# Patient Record
Sex: Male | Born: 1990 | Race: White | Hispanic: No | Marital: Single | Smoking: Never smoker
Health system: Southern US, Community
[De-identification: ages and names within clinical notes are randomized; demographics above are authoritative.]

---

## 2018-03-15 ENCOUNTER — Encounter (HOSPITAL_COMMUNITY): Payer: Self-pay

## 2018-03-15 ENCOUNTER — Emergency Department (HOSPITAL_COMMUNITY): Payer: Self-pay

## 2018-03-15 ENCOUNTER — Emergency Department (HOSPITAL_COMMUNITY)
Admission: EM | Admit: 2018-03-15 | Discharge: 2018-03-16 | Disposition: A | Discharge status: Home / Self Care | Payer: Self-pay | Attending: Emergency Medicine | Admitting: Emergency Medicine

## 2018-03-15 ENCOUNTER — Other Ambulatory Visit: Payer: Self-pay

## 2018-03-15 DIAGNOSIS — S61452A Open bite of left hand, initial encounter: Secondary | ICD-10-CM | POA: Insufficient documentation

## 2018-03-15 DIAGNOSIS — Y9389 Activity, other specified: Secondary | ICD-10-CM | POA: Insufficient documentation

## 2018-03-15 DIAGNOSIS — W540XXA Bitten by dog, initial encounter: Secondary | ICD-10-CM | POA: Insufficient documentation

## 2018-03-15 DIAGNOSIS — Y999 Unspecified external cause status: Secondary | ICD-10-CM | POA: Insufficient documentation

## 2018-03-15 DIAGNOSIS — Y929 Unspecified place or not applicable: Secondary | ICD-10-CM | POA: Insufficient documentation

## 2018-03-15 DIAGNOSIS — Z23 Encounter for immunization: Secondary | ICD-10-CM | POA: Insufficient documentation

## 2018-03-15 MED ORDER — AMOXICILLIN-POT CLAVULANATE 875-125 MG PO TABS: 1.0000 | ORAL_TABLET | Freq: Two times a day (BID) | ORAL | 0 refills | Status: AC

## 2018-03-15 MED ORDER — TETANUS-DIPHTH-ACELL PERTUSSIS 5-2.5-18.5 LF-MCG/0.5 IM SUSP
0.5000 mL | Freq: Once | INTRAMUSCULAR | Status: AC
  Administered 2018-03-15: 0.5 mL via INTRAMUSCULAR
  Filled 2018-03-15: qty 0.5

## 2018-03-15 MED ORDER — LIDOCAINE HCL (PF) 1 % IJ SOLN
10.0000 mL | Freq: Once | INTRAMUSCULAR | Status: DC
  Filled 2018-03-15: qty 10

## 2018-03-15 MED ORDER — AMOXICILLIN-POT CLAVULANATE 875-125 MG PO TABS
1.0000 | ORAL_TABLET | Freq: Once | ORAL | Status: AC
  Administered 2018-03-16: 1 via ORAL
  Filled 2018-03-15: qty 1

## 2018-03-15 MED ORDER — OXYCODONE-ACETAMINOPHEN 5-325 MG PO TABS
1.0000 | ORAL_TABLET | Freq: Once | ORAL | Status: AC
  Administered 2018-03-15: 1 via ORAL
  Filled 2018-03-15: qty 1

## 2018-03-15 MED ORDER — HYDROCODONE-ACETAMINOPHEN 5-325 MG PO TABS: 1.0000 | ORAL_TABLET | Freq: Four times a day (QID) | ORAL | 0 refills | Status: AC | PRN

## 2018-03-15 NOTE — ED Provider Notes (Signed)
I saw and evaluated the patient, reviewed the resident's note and I agree with the findings and plan.  Pertinent History: The patient is a 27 year old male who suffered an injury to his left hand tonight when a pit bull belonging to his neighbor jumped up and bit him on the thenar area of his left hand.  This was acute in onset, the pain is persistent, worse with palpation and associated with a small amount of bleeding.  He was given a tetanus shot on arrival as he was out of date.  On exam the patient does have an open gaping wound which is irregular in its borders.  Underlying muscle is exposed, there is no deep tissue seen such as vessels, nerves or bones.   I was personally present and directly supervised the following procedures:  Wound care / repair - see residents note Laceration repair  Final diagnoses:  Dog bite, initial encounter  Dog bite of left hand, initial encounter      Eber HongMiller, Taleeya Blondin, MD 03/16/18 0001

## 2018-03-15 NOTE — ED Provider Notes (Signed)
[facility] EMERGENCY DEPARTMENT Provider Note   CSN: [id number] Arrival date & time: 03/15/18  2058     History   Chief Complaint Chief Complaint  Patient presents with  . Animal Bite    HPI Cameron Rodgers is a 27 y.o. male.  The history is provided by the patient.  Animal Bite  Contact animal:  Dog Location:  Hand Hand injury location:  L hand Time since incident:  1 hour Pain details:    Quality:  Aching   Severity:  Moderate   Timing:  Constant Incident location: at neighbors house. Notifications:  Animal control (animal control form filled out here) Animal's rabies vaccination status:  Up to date Animal in possession: yes   Tetanus status:  Unknown Associated symptoms: swelling   Associated symptoms: no fever and no numbness     History reviewed. No pertinent past medical history.  There are no active problems to display for this patient.   History reviewed. No pertinent surgical history.     Home Medications    Prior to Admission medications   Medication Sig Start Date End Date Taking? Authorizing Provider  amoxicillin-clavulanate (AUGMENTIN) 875-125 MG tablet Take 1 tablet by mouth every 12 (twelve) hours for 7 days. 03/15/18 03/22/18  [name], DO  HYDROcodone-acetaminophen (NORCO/VICODIN) 5-325 MG tablet Take 1 tablet by mouth every 6 (six) hours as needed for moderate pain or severe pain. 03/15/18   [name], DO    Family History History reviewed. No pertinent family history.  Social History Social History   Tobacco Use  . Smoking status: Never Smoker  Substance Use Topics  . Alcohol use: No    Frequency: Never  . Drug use: No     Allergies   Patient has no known allergies.   Review of Systems Review of Systems  Constitutional: Negative for chills and fever.  HENT: Negative for sore throat.   Respiratory: Negative for shortness of breath.   Cardiovascular: Negative for chest pain.  Gastrointestinal: Negative  for abdominal pain, nausea and vomiting.  Musculoskeletal: Negative for back pain and neck pain.  Skin: Positive for wound.  Neurological: Negative for weakness and numbness.  All other systems reviewed and are negative.    Physical Exam Updated Vital Signs BP (!) 157/89   Pulse (!) 116   Temp 98.8 F (37.1 C)   Resp 19   SpO2 100%   Physical Exam  Constitutional: He appears well-developed and well-nourished.  HENT:  Head: Normocephalic and atraumatic.  Mouth/Throat: Oropharynx is clear and moist.  Eyes: Conjunctivae are normal.  Neck: Neck supple.  Cardiovascular: Normal rate, regular rhythm and intact distal pulses.  Pulmonary/Chest: Effort normal and breath sounds normal. No respiratory distress. He has no wheezes. He has no rales.  Abdominal: Soft. He exhibits no distension. There is no tenderness. There is no guarding.  Musculoskeletal: He exhibits no edema.       Left hand: He exhibits laceration.       Hands: Neurological: He is alert.  Skin: Skin is warm and dry.  Psychiatric: He has a normal mood and affect.  Nursing note and vitals reviewed.      ED Treatments / Results  Labs (all labs ordered are listed, but only abnormal results are displayed) Labs Reviewed - No data to display  EKG  EKG Interpretation None       Radiology Dg Hand Complete Left  Result Date: 03/15/2018 CLINICAL DATA:  Recent dog bite with significant soft  tissue injury EXAM: LEFT HAND - COMPLETE 3+ VIEW COMPARISON:  None. FINDINGS: Significant soft tissue defect is noted along the first metacarpal consistent with the recent injury. No acute fracture or dislocation is seen. No other focal abnormality is noted. IMPRESSION: Soft tissue defect consistent with the recent injury. No acute bony abnormality is noted. Electronically Signed   By: Alcide Clever M.D.   On: 03/15/2018 21:33    Procedures .Marland KitchenLaceration Repair Date/Time: 03/15/2018 11:42 PM Performed by: [name],  DO Authorized by: Eber Hong, MD   Consent:    Consent obtained:  Verbal   Consent given by:  Patient Laceration details:    Location:  Hand   Hand location:  L palm   Length (cm):  4   Depth (mm):  7 Pre-procedure details:    Preparation:  Patient was prepped and draped in usual sterile fashion and imaging obtained to evaluate for foreign bodies Exploration:    Wound exploration: wound explored through full range of motion and entire depth of wound probed and visualized   Treatment:    Area cleansed with:  Saline   Amount of cleaning:  Extensive   Irrigation solution:  Sterile saline   Irrigation volume:  2000 Skin repair:    Repair method:  Sutures   Suture size:  4-0   Suture material:  Chromic gut   Suture technique: 4 vertical mattress, 1 horizontal mattress, 2 simple interrupted.   Number of sutures:  7 Approximation:    Approximation:  Loose   Vermilion border: well-aligned   Post-procedure details:    Dressing:  Antibiotic ointment, splint for protection, sterile dressing and non-adherent dressing   Patient tolerance of procedure:  Tolerated well, no immediate complications   (including critical care time)  Medications Ordered in ED Medications  lidocaine (PF) (XYLOCAINE) 1 % injection 10 mL (not administered)  amoxicillin-clavulanate (AUGMENTIN) 875-125 MG per tablet 1 tablet (not administered)  Tdap (BOOSTRIX) injection 0.5 mL (0.5 mLs Intramuscular Given 03/15/18 2151)  oxyCODONE-acetaminophen (PERCOCET/ROXICET) 5-325 MG per tablet 1 tablet (1 tablet Oral Given 03/15/18 2151)     Initial Impression / Assessment and Plan / ED Course  I have reviewed the triage vital signs and the nursing notes.  Pertinent labs & imaging results that were available during my care of the patient were reviewed by me and considered in my medical decision making (see chart for details).     Patient is a 27 year old male with no significant medical history who presents with pit  bull dog bite to the left hand.  Patient was talking with his neighbor and the dog bit his left hand.  Dog is in position and is up-to-date on its shots reportedly.  Patient is unsure of tetanus status so this is updated here.  On exam he has a 4 cm deep irregular laceration with a skin flap.  Slow venous oozing.  No arterial bleed.  Muscle, nerve, blood vessels can be seen.  Thumb is neurovascularly intact.  Did not appear to be any tendon injury.  X-ray did not show any signs of fracture or foreign body.  Wound is copiously irrigated with normal saline.  I spoke with Dr. Janee Morn with orthopedic hand surgery who recommended loose closure to allow drainage, and follow-up in his clinic on Thursday.  Patient started on Augmentin.  Pain controlled with Percocet.  Wound is dressed with antibiotic ointment, sterile gauze and a thumb spica splint.  Work note given.  Prescription for Norco and Augmentin given.  Final Clinical Impressions(s) / ED Diagnoses   Final diagnoses:  Dog bite, initial encounter  Dog bite of left hand, initial encounter    ED Discharge Orders        Ordered    HYDROcodone-acetaminophen (NORCO/VICODIN) 5-325 MG tablet  Every 6 hours PRN     03/15/18 2328    amoxicillin-clavulanate (AUGMENTIN) 875-125 MG tablet  Every 12 hours     03/15/18 2328       Dwana MelenaDong, Riyan Gavina, DO 03/15/18 2344    Eber HongMiller, Brian, MD 03/16/18 0000

## 2018-03-15 NOTE — ED Triage Notes (Signed)
Pt endorses being bit by his neighbors dog in the left hand, pt has deep bite with bleeding around the left thumb and possible deformity to the left thumb. Pt states that hand is "numb" Unknown if dog had up to date vaccinations. VSS

## 2018-03-15 NOTE — Discharge Instructions (Signed)
Follow-up with Dr. Janee Mornhompson likely on Thursday.  If you develop fevers, chills, redness, or pus draining from the wound, return to the ED as this likely means the wound has become infected.

## 2018-03-16 NOTE — Progress Notes (Signed)
Orthopedic Tech Progress Note Patient Details:  Cameron EllisRalf Rodgers [birth year] [id number]  Ortho Devices Type of Ortho Device: Thumb velcro splint Ortho Device/Splint Location: lue Ortho Device/Splint Interventions: Ordered, Application, Adjustment   Post Interventions Patient Tolerated: Well Instructions Provided: Care of device, Adjustment of device   Trinna PostMartinez, Aanchal Cope J 03/16/2018, 1:18 AM

## 2018-03-16 NOTE — ED Notes (Signed)
Pt understood dc material. NAD noted. Scripts given at dc 

## 2018-03-24 ENCOUNTER — Other Ambulatory Visit: Payer: Self-pay

## 2018-03-24 ENCOUNTER — Encounter (HOSPITAL_COMMUNITY): Payer: Self-pay

## 2018-03-24 ENCOUNTER — Emergency Department (HOSPITAL_COMMUNITY)
Admission: EM | Admit: 2018-03-24 | Discharge: 2018-03-24 | Disposition: A | Discharge status: Home / Self Care | Payer: Self-pay | Attending: Emergency Medicine | Admitting: Emergency Medicine

## 2018-03-24 DIAGNOSIS — W540XXA Bitten by dog, initial encounter: Secondary | ICD-10-CM

## 2018-03-24 DIAGNOSIS — Z4800 Encounter for change or removal of nonsurgical wound dressing: Secondary | ICD-10-CM | POA: Insufficient documentation

## 2018-03-24 LAB — CBC WITH DIFFERENTIAL/PLATELET
Basophils Absolute: 0 10*3/uL (ref 0.0–0.1)
Basophils Relative: 0 %
EOS ABS: 0.2 10*3/uL (ref 0.0–0.7)
EOS PCT: 3 %
HCT: 45.7 % (ref 39.0–52.0)
Hemoglobin: 15 g/dL (ref 13.0–17.0)
LYMPHS ABS: 1.9 10*3/uL (ref 0.7–4.0)
LYMPHS PCT: 28 %
MCH: 29.1 pg (ref 26.0–34.0)
MCHC: 32.8 g/dL (ref 30.0–36.0)
MCV: 88.6 fL (ref 78.0–100.0)
MONO ABS: 0.3 10*3/uL (ref 0.1–1.0)
Monocytes Relative: 4 %
Neutro Abs: 4.4 10*3/uL (ref 1.7–7.7)
Neutrophils Relative %: 65 %
Platelets: 318 10*3/uL (ref 150–400)
RBC: 5.16 MIL/uL (ref 4.22–5.81)
RDW: 12.8 % (ref 11.5–15.5)
WBC: 6.7 10*3/uL (ref 4.0–10.5)

## 2018-03-24 LAB — COMPREHENSIVE METABOLIC PANEL
ALBUMIN: 4 g/dL (ref 3.5–5.0)
ALT: 37 U/L (ref 17–63)
AST: 25 U/L (ref 15–41)
Alkaline Phosphatase: 80 U/L (ref 38–126)
Anion gap: 10 (ref 5–15)
BILIRUBIN TOTAL: 0.4 mg/dL (ref 0.3–1.2)
BUN: 12 mg/dL (ref 6–20)
CHLORIDE: 101 mmol/L (ref 101–111)
CO2: 25 mmol/L (ref 22–32)
CREATININE: 0.66 mg/dL (ref 0.61–1.24)
Calcium: 9.6 mg/dL (ref 8.9–10.3)
GFR calc Af Amer: >60 mL/min (ref >60)
GLUCOSE: 105 mg/dL — AB (ref 65–99)
Potassium: 4.6 mmol/L (ref 3.5–5.1)
Sodium: 136 mmol/L (ref 135–145)
TOTAL PROTEIN: 7.3 g/dL (ref 6.5–8.1)

## 2018-03-24 NOTE — ED Provider Notes (Signed)
[name] EMERGENCY DEPARTMENT Provider Note   CSN: [id number] Arrival date & time: 03/24/18  1249  History   Chief Complaint Chief Complaint  Patient presents with  . Animal Bite   HPI Cameron Rodgers is a 27 y.o. male.  HPI   27 year old male presents today with complaints of wound to his left hand.  Patient was seen on 03/15/2018 (9 days ago) after suffering a dog bite to his right hand.  He was placed on Augmentin, and given follow-up information for hand surgery.  Patient reports that he called the office and was told he needed to pay $250 prior to be being seen and noted that he did not have the money to follow-up.  Patient reports that he finished his antibiotics yesterday and today noted some oozing from the wound.  He notes on and off swelling around the site of the wound, denies any fever, he does report pain with range of motion.  Patient reports some minor surrounding redness that has been present since the initial incident.  She notes he has not been using the splint as it causes discomfort in itself.   History reviewed. No pertinent past medical history.  There are no active problems to display for this patient.   History reviewed. No pertinent surgical history.     Home Medications    Prior to Admission medications   Medication Sig Start Date End Date Taking? Authorizing Provider  HYDROcodone-acetaminophen (NORCO/VICODIN) 5-325 MG tablet Take 1 tablet by mouth every 6 (six) hours as needed for moderate pain or severe pain. 03/15/18   [name], DO    Family History History reviewed. No pertinent family history.  Social History Social History   Tobacco Use  . Smoking status: Never Smoker  . Smokeless tobacco: Never Used  Substance Use Topics  . Alcohol use: No    Frequency: Never  . Drug use: No     Allergies   Patient has no known allergies.   Review of Systems Review of Systems  All other systems reviewed and are  negative.   Physical Exam Updated Vital Signs BP 117/81 (BP Location: Right Arm)   Pulse 96   Temp 97.8 F (36.6 C) (Oral)   Resp 16   SpO2 100%   Physical Exam  Constitutional: He is oriented to person, place, and time. He appears well-developed and well-nourished.  HENT:  Head: Normocephalic and atraumatic.  Eyes: Pupils are equal, round, and reactive to light. Conjunctivae are normal. Right eye exhibits no discharge. Left eye exhibits no discharge. No scleral icterus.  Neck: Normal range of motion. No JVD present. No tracheal deviation present.  Pulmonary/Chest: Effort normal. No stridor.  Musculoskeletal:  Left thumb noted as below- Full Active range of motion although limited secondary to pain and swelling- small amount of serous fluid from wound- no purulence or significant warmth to touch   Neurological: He is alert and oriented to person, place, and time. Coordination normal.  Psychiatric: He has a normal mood and affect. His behavior is normal. Judgment and thought content normal.  Nursing note and vitals reviewed.             ED Treatments / Results  Labs (all labs ordered are listed, but only abnormal results are displayed) Labs Reviewed  COMPREHENSIVE METABOLIC PANEL - Abnormal; Notable for the following components:      Result Value   Glucose, Bld 105 (*)    All other components within normal limits  CBC  WITH DIFFERENTIAL/PLATELET    EKG None  Radiology No results found.  Procedures Procedures (including critical care time)  Medications Ordered in ED Medications - No data to display   Initial Impression / Assessment and Plan / ED Course  I have reviewed the triage vital signs and the nursing notes.  Pertinent labs & imaging results that were available during my care of the patient were reviewed by me and considered in my medical decision making (see chart for details).    Final Clinical Impressions(s) / ED Diagnoses   Final diagnoses:   Dog bite, initial encounter    Labs: CMP, CBC  Imaging:   Consults: Case Management   Therapeutics:  Discharge Meds:   Assessment/Plan: 27 year old male presents today with wound check.  Patient suffered a bite to his hand and was treated with antibiotics and instructed to follow up as outpatient in clinic.  Patient attempted to follow-up with hand surgery but was told that he would have to have $200 at the visit.  Patient unable to afford $200 so was not seen and is returning here for evaluation.  Patient's wound does not appear to be infected at this time he does have swelling which he reports has been coming and going and likely dependent.  Patient does have some functional deficits secondary to the swelling.  I spoke with case management as patient would need outpatient follow-up with hand specialist that was originally arranged.  She will be contacting office to help arrange outpatient follow-up.  Patient will return if he is unable to follow-up as an outpatient or has any new or worsening signs or symptoms.  I encouraged patient to continue using his splint, follow-up with hand specialist and return as needed.  No indication for continued antibiotics at this time.  He verbalized understanding and agreement to today's plan.    ED Discharge Orders    None       Rosalio LoudHedges, Sheilah Rayos, PA-C 03/24/18 1954    Little, Ambrose Finlandachel Morgan, MD 03/25/18 [phone]

## 2018-03-24 NOTE — Discharge Instructions (Addendum)
Please read attached information. If you experience any new or worsening signs or symptoms please return to the emergency room for evaluation. Please contact Dr. Carollee Massedhompson's office for follow up evaluation.

## 2018-03-24 NOTE — ED Triage Notes (Signed)
Pt states he was bit by a dog on 3/19. Pt states he had it cleaned and stitched up here and abx and he finished the treatment but states he has some oozing last night.

## 2018-08-11 ENCOUNTER — Encounter (HOSPITAL_COMMUNITY): Payer: Self-pay | Admitting: Emergency Medicine

## 2018-08-11 ENCOUNTER — Emergency Department (HOSPITAL_COMMUNITY)
Admission: EM | Admit: 2018-08-11 | Discharge: 2018-08-11 | Disposition: A | Discharge status: Home / Self Care | Payer: Self-pay | Attending: Emergency Medicine | Admitting: Emergency Medicine

## 2018-08-11 ENCOUNTER — Emergency Department (HOSPITAL_COMMUNITY): Payer: Self-pay

## 2018-08-11 ENCOUNTER — Other Ambulatory Visit: Payer: Self-pay

## 2018-08-11 DIAGNOSIS — L720 Epidermal cyst: Secondary | ICD-10-CM | POA: Insufficient documentation

## 2018-08-11 LAB — I-STAT CHEM 8, ED
BUN: 17 mg/dL (ref 6–20)
CREATININE: 0.6 mg/dL — AB (ref 0.61–1.24)
Calcium, Ion: 1.18 mmol/L (ref 1.15–1.40)
Chloride: 101 mmol/L (ref 98–111)
GLUCOSE: 95 mg/dL (ref 70–99)
HEMATOCRIT: 48 % (ref 39.0–52.0)
HEMOGLOBIN: 16.3 g/dL (ref 13.0–17.0)
POTASSIUM: 4.3 mmol/L (ref 3.5–5.1)
Sodium: 139 mmol/L (ref 135–145)
TCO2: 27 mmol/L (ref 22–32)

## 2018-08-11 MED ORDER — LIDOCAINE-EPINEPHRINE (PF) 2 %-1:200000 IJ SOLN
10.0000 mL | Freq: Once | INTRAMUSCULAR | Status: AC
  Administered 2018-08-11: 10 mL
  Filled 2018-08-11: qty 20

## 2018-08-11 MED ORDER — SODIUM CHLORIDE 0.9 % IV BOLUS
500.0000 mL | Freq: Once | INTRAVENOUS | Status: AC
  Administered 2018-08-11: 500 mL via INTRAVENOUS

## 2018-08-11 MED ORDER — ACETAMINOPHEN 500 MG PO TABS
1000.0000 mg | ORAL_TABLET | Freq: Once | ORAL | Status: AC
  Administered 2018-08-11: 1000 mg via ORAL
  Filled 2018-08-11: qty 2

## 2018-08-11 MED ORDER — DOXYCYCLINE HYCLATE 100 MG PO CAPS: 100.0000 mg | ORAL_CAPSULE | Freq: Two times a day (BID) | ORAL | 0 refills | Status: AC

## 2018-08-11 MED ORDER — IOHEXOL 300 MG/ML  SOLN
100.0000 mL | Freq: Once | INTRAMUSCULAR | Status: AC
  Administered 2018-08-11: 75 mL via INTRAVENOUS

## 2018-08-11 NOTE — ED Notes (Signed)
ED Provider at bedside with Ultrasound.

## 2018-08-11 NOTE — Discharge Instructions (Addendum)

## 2018-08-11 NOTE — ED Notes (Signed)
ED Provider at bedside. 

## 2018-08-11 NOTE — ED Notes (Signed)
Pt verbalized understanding of discharge instructions and denies any further questions at this time.   

## 2018-08-11 NOTE — ED Triage Notes (Signed)
Pt reports a cyst on the back of his neck for a year, it is now painful without drainage. He denies fevers. A/O at triage. NAD.

## 2018-08-11 NOTE — ED Notes (Signed)
Patient transported to CT 

## 2018-08-11 NOTE — ED Provider Notes (Signed)
[name] EMERGENCY DEPARTMENT Provider Note   CSN: [id number] Arrival date & time: 08/11/18  1191     History   Chief Complaint Chief Complaint  Patient presents with  . Cyst    HPI Cameron Rodgers is a 27 y.o. male who presents today for evaluation of a cyst.  He has had a cyst over his lower neck and reports that it has been getting more painful.  He has never been seen or evaluated for this before.  He reports that it hurts when he moves his head.  He denies any fevers or chills, no nausea vomiting or diarrhea.  No abdominal pain.   HPI  History reviewed. No pertinent past medical history.  There are no active problems to display for this patient.   History reviewed. No pertinent surgical history.      Home Medications    Prior to Admission medications   Medication Sig Start Date End Date Taking? Authorizing Provider  doxycycline (VIBRAMYCIN) 100 MG capsule Take 1 capsule (100 mg total) by mouth 2 (two) times daily. 08/11/18   Cristina Gong, PA-C  HYDROcodone-acetaminophen (NORCO/VICODIN) 5-325 MG tablet Take 1 tablet by mouth every 6 (six) hours as needed for moderate pain or severe pain. 03/15/18   [name], DO    Family History No family history on file.  Social History Social History   Tobacco Use  . Smoking status: Never Smoker  . Smokeless tobacco: Never Used  Substance Use Topics  . Alcohol use: No    Frequency: Never  . Drug use: No     Allergies   Patient has no known allergies.   Review of Systems Review of Systems  Constitutional: Negative for chills and fever.  Skin: Positive for color change and wound.  All other systems reviewed and are negative.    Physical Exam Updated Vital Signs BP 123/85   Pulse 88   Temp 98.2 F (36.8 C) (Oral)   Resp 16   Ht 5\' 3"  (1.6 m)   Wt 122.5 kg   SpO2 100%   BMI 47.83 kg/m   Physical Exam  Constitutional: He appears well-developed and well-nourished. No distress.   HENT:  Head: Normocephalic and atraumatic.  Eyes: Conjunctivae are normal. Right eye exhibits no discharge. Left eye exhibits no discharge. No scleral icterus.  Neck: Normal range of motion. Neck supple.  Cardiovascular: Normal rate and regular rhythm.  Pulmonary/Chest: Effort normal. No stridor. No respiratory distress.  Abdominal: He exhibits no distension.  Musculoskeletal: He exhibits no edema or deformity.  Neurological: He is alert. He exhibits normal muscle tone.  Skin: Skin is warm and dry. He is not diaphoretic.  There is a mild area of redness on the lower midline C-spine.  There is subcutaneous induration, approximately 5 cm this area is tender to palpation.  There is no drainage.  Not clearly fluctuant, more indurated.  Psychiatric: He has a normal mood and affect. His behavior is normal.  Nursing note and vitals reviewed.    ED Treatments / Results  Labs (all labs ordered are listed, but only abnormal results are displayed) Labs Reviewed  I-STAT CHEM 8, ED - Abnormal; Notable for the following components:      Result Value   Creatinine, Ser 0.60 (*)    All other components within normal limits    EKG None  Radiology Ct Soft Tissue Neck W Contrast  Result Date: 08/11/2018 CLINICAL DATA:  Posterior painful neck mass. EXAM: CT NECK  WITH CONTRAST TECHNIQUE: Multidetector CT imaging of the neck was performed using the standard protocol following the bolus administration of intravenous contrast. CONTRAST:  75mL OMNIPAQUE IOHEXOL 300 MG/ML  SOLN COMPARISON:  None. FINDINGS: PHARYNX AND LARYNX: --Nasopharynx: Fossae of Rosenmuller are clear. Normal adenoid tonsils for age. --Oral cavity and oropharynx: The palatine and lingual tonsils are normal. The visible oral cavity and floor of mouth are normal. --Hypopharynx: Normal vallecula and pyriform sinuses. --Larynx: Normal epiglottis and pre-epiglottic space. Normal aryepiglottic and vocal folds. --Retropharyngeal space: No  abscess, effusion or lymphadenopathy. SALIVARY GLANDS: --Parotid: No mass lesion or inflammation. No sialolithiasis or ductal dilatation. --Submandibular: Symmetric without inflammation. No sialolithiasis or ductal dilatation. --Sublingual: Normal. No ranula or other visible lesion of the base of tongue and floor of mouth. THYROID: Normal. LYMPH NODES: No enlarged or abnormal density lymph nodes. VASCULAR: Major cervical vessels are patent. LIMITED INTRACRANIAL: Normal. VISUALIZED ORBITS: Normal. MASTOIDS AND VISUALIZED PARANASAL SINUSES: No fluid levels or advanced mucosal thickening. No mastoid effusion. SKELETON: No bony spinal canal stenosis. No lytic or blastic lesions. UPPER CHEST: Clear. OTHER: At the posterior midline neck, in line with the C5 spinous process, there is a round subcutaneous lesion measuring 21 x 19 mm. There is mild induration of the adjacent subcutaneous fat. The lesion extends to the skin. IMPRESSION: Rounded subcutaneous lesion of the posterior midline neck, most consistent with sebaceous/epidermal inclusion cyst, with mild associated inflammation. Electronically Signed   By: Deatra RobinsonKevin  Herman M.D.   On: 08/11/2018 14:32    Procedures .Marland Kitchen.Incision and Drainage Date/Time: 08/11/2018 4:19 PM Performed by: Cristina GongHammond, Lilyrose Tanney W, PA-C Authorized by: Cristina GongHammond, Riccardo Holeman W, PA-C   Consent:    Consent obtained:  Verbal   Consent given by:  Patient   Risks discussed:  Bleeding, damage to other organs, incomplete drainage, infection and pain (Recurrence of cyst)   Alternatives discussed:  No treatment, referral and alternative treatment Location:    Indications for incision and drainage: Inflamed versus infected cyst.   Size:  3x5 cm   Location:  Neck   Neck location: midline posterior. Pre-procedure details:    Skin preparation:  Chloraprep Anesthesia (see MAR for exact dosages):    Anesthesia method:  Local infiltration   Local anesthetic:  Lidocaine 2% WITH epi Post-procedure  details:    Patient tolerance of procedure:  Procedure terminated at patient's request Comments:     During numbing the needle was inserted parallel to the skin, and the needle sticks were kept extremely superficial with care to avoid puncturing surrounding structures     (including critical care time) EMERGENCY DEPARTMENT US SOFT TISSUE INTERPRETATION "Study: Limited Soft Tissue Ultrasound"  INDICATIONS: Pain Multiple views of the body part were obtained in real-time with a multi-frequency linear probe  PERFORMED BY: Myself IMAGES ARCHIVED?: Yes SIDE:Midline BODY PART:Neck INTERPRETATION:  Cellulitis present and fluid with solid material diffusely through.      Medications Ordered in ED Medications  acetaminophen (TYLENOL) tablet 1,000 mg (1,000 mg Oral Given 08/11/18 1227)  sodium chloride 0.9 % bolus 500 mL (0 mLs Intravenous Stopped 08/11/18 1327)  iohexol (OMNIPAQUE) 300 MG/ML solution 100 mL (75 mLs Intravenous Contrast Given 08/11/18 1405)  lidocaine-EPINEPHrine (XYLOCAINE W/EPI) 2 %-1:200000 (PF) injection 10 mL (10 mLs Infiltration Given 08/11/18 1556)     Initial Impression / Assessment and Plan / ED Course  I have reviewed the triage vital signs and the nursing notes.  Pertinent labs & imaging results that were available during my care of the patient  were reviewed by me and considered in my medical decision making (see chart for details).  Clinical Course as of Aug 11 1620  Wed Aug 11, 2018  1458 Discussed treatment options with patient, including antibiotics with surgical follow-up versus antibiotics with I&D here.   [EH]    Clinical Course User Index [EH] Cristina GongHammond, Aveen Stansel W, PA-C    She presents today for evaluation of a painful swelling on the back of his neck.  This is been there asymptomatically for approximately 1 year however has become increasingly painful over the past few days.  As this is on the posterior midline neck CT was obtained showing a cyst with  surrounding inflammation.  discussed at length with patient the risks and benefits of I&D, including that the cyst would grow back, may not be infected and damage to surrounding structures.  I recommended that, based on location, patient started on p.o. outpatient doxycycline with surgery follow-up.  Patient stated that since he does not have insurance he would prefer for I&D here to help with the symptoms knowing that the cyst would most likely recur and that I&D may not end up ultimately being necessary.  I&D was attempted, patient was numbed on the skin, however due to the location and size I did not feel that it was safe to stick a needle and numb the deeper aspect of the cyst.  Patient stated that he was not adequately numb and would prefer to be seen by general surgery.  Incision was not performed.  Patient started on p.o. doxycycline, given outpatient follow-up.  Return precautions were discussed with patient who states their understanding.  At the time of discharge patient denied any unaddressed complaints or concerns.  Patient is agreeable for discharge home.   Final Clinical Impressions(s) / ED Diagnoses   Final diagnoses:  Cyst of skin and subcutaneous tissue    ED Discharge Orders         Ordered    doxycycline (VIBRAMYCIN) 100 MG capsule  2 times daily     08/11/18 1600           Norman ClayHammond, Chantia Amalfitano W, PA-C 08/11/18 1621    Jacalyn LefevreHaviland, Julie, MD 08/11/18 [phone]

## 2018-08-13 ENCOUNTER — Encounter (HOSPITAL_COMMUNITY): Payer: Self-pay

## 2018-08-13 ENCOUNTER — Emergency Department (HOSPITAL_COMMUNITY)
Admission: EM | Admit: 2018-08-13 | Discharge: 2018-08-13 | Disposition: A | Discharge status: Home / Self Care | Payer: Self-pay | Attending: Emergency Medicine | Admitting: Emergency Medicine

## 2018-08-13 ENCOUNTER — Other Ambulatory Visit: Payer: Self-pay

## 2018-08-13 DIAGNOSIS — L720 Epidermal cyst: Secondary | ICD-10-CM | POA: Insufficient documentation

## 2018-08-13 MED ORDER — ACETAMINOPHEN-CODEINE #3 300-30 MG PO TABS: 1.0000 | ORAL_TABLET | Freq: Three times a day (TID) | ORAL | 0 refills | Status: AC | PRN

## 2018-08-13 NOTE — ED Provider Notes (Signed)
[name] EMERGENCY DEPARTMENT Provider Note   CSN: [id number] Arrival date & time: 08/13/18  0818   History   Chief Complaint Chief Complaint  Patient presents with  . Abscess    HPI Cameron Rodgers is a 27 y.o. male who presents for evaluation of a neck cyst. Cyst has been presents for over one year however has become increasing more painful. He has been seen for this issue in the ED on 08/11/18. At that time patient had an [place] and a CT soft tissue neck which showed a midline lesion measuring 21x19 mm with middle induration of adjacent subcutaneous fat. Impression of a rounded lesion in the posterior midline neck consistent with a sebaceous/epidermal inclusion cyst with associated inflammation. Denies decrease ROM in the neck, fever, chills, nausea, vomiting, headache, chest pain, SOB, back pain. States he is having difficulty sleeping at night when he lays on his back with the pressure of his pillow on the cyst.   History reviewed. No pertinent past medical history.  There are no active problems to display for this patient.   History reviewed. No pertinent surgical history.      Home Medications    Prior to Admission medications   Medication Sig Start Date End Date Taking? Authorizing Provider  acetaminophen-codeine (TYLENOL #3) 300-30 MG tablet Take 1 tablet by mouth every 8 (eight) hours as needed for moderate pain. 08/13/18   Ariany Kesselman A, PA-C  doxycycline (VIBRAMYCIN) 100 MG capsule Take 1 capsule (100 mg total) by mouth 2 (two) times daily. 08/11/18   Cristina Gong, PA-C  HYDROcodone-acetaminophen (NORCO/VICODIN) 5-325 MG tablet Take 1 tablet by mouth every 6 (six) hours as needed for moderate pain or severe pain. 03/15/18   [name], DO    Family History No family history on file.  Social History Social History   Tobacco Use  . Smoking status: Never Smoker  . Smokeless tobacco: Never Used  Substance Use Topics  . Alcohol use: No   Frequency: Never  . Drug use: No     Allergies   Patient has no known allergies.   Review of Systems Review of Systems  Constitutional: Negative for chills, diaphoresis and fever.  HENT: Negative.   Respiratory: Negative.   Cardiovascular: Negative.   Gastrointestinal: Negative.   Skin: Positive for color change.  Neurological: Negative.   All other systems reviewed and are negative.    Physical Exam Updated Vital Signs BP 133/90   Pulse (!) 102   Temp 98.5 F (36.9 C) (Oral)   Resp 16   SpO2 95%   Physical Exam  Constitutional: He appears well-developed and well-nourished. No distress.  HENT:  Head: Atraumatic.  Eyes: Pupils are equal, round, and reactive to light.  Neck: Trachea normal, normal range of motion and full passive range of motion without pain. No tracheal tenderness, no spinous process tenderness and no muscular tenderness present. No tracheal deviation, no edema and normal range of motion present.    Cardiovascular: Normal rate.  Pulmonary/Chest: Effort normal. No respiratory distress.  Abdominal: He exhibits no distension.  Musculoskeletal: Normal range of motion.       Right shoulder: Normal.       Left shoulder: Normal.       Cervical back: He exhibits normal range of motion, no tenderness, no bony tenderness, no edema, no deformity, no laceration, no pain, no spasm and normal pulse.       Thoracic back: Normal.  Neurological: He is alert. He  has normal strength. No cranial nerve deficit or sensory deficit. Coordination and gait normal.  Skin: Skin is warm. No rash noted. There is erythema.  Mild 5 cm area of erythema to the midline posterior C-spine. Tenderness to palpation. No area of induration, fluctuance or drainage. No warmth      ED Treatments / Results  Labs (all labs ordered are listed, but only abnormal results are displayed) Labs Reviewed - No data to display  EKG None  Radiology Ct Soft Tissue Neck W Contrast  Result Date:  08/11/2018 CLINICAL DATA:  Posterior painful neck mass. EXAM: CT NECK WITH CONTRAST TECHNIQUE: Multidetector CT imaging of the neck was performed using the standard protocol following the bolus administration of intravenous contrast. CONTRAST:  75mL OMNIPAQUE IOHEXOL 300 MG/ML  SOLN COMPARISON:  None. FINDINGS: PHARYNX AND LARYNX: --Nasopharynx: Fossae of Rosenmuller are clear. Normal adenoid tonsils for age. --Oral cavity and oropharynx: The palatine and lingual tonsils are normal. The visible oral cavity and floor of mouth are normal. --Hypopharynx: Normal vallecula and pyriform sinuses. --Larynx: Normal epiglottis and pre-epiglottic space. Normal aryepiglottic and vocal folds. --Retropharyngeal space: No abscess, effusion or lymphadenopathy. SALIVARY GLANDS: --Parotid: No mass lesion or inflammation. No sialolithiasis or ductal dilatation. --Submandibular: Symmetric without inflammation. No sialolithiasis or ductal dilatation. --Sublingual: Normal. No ranula or other visible lesion of the base of tongue and floor of mouth. THYROID: Normal. LYMPH NODES: No enlarged or abnormal density lymph nodes. VASCULAR: Major cervical vessels are patent. LIMITED INTRACRANIAL: Normal. VISUALIZED ORBITS: Normal. MASTOIDS AND VISUALIZED PARANASAL SINUSES: No fluid levels or advanced mucosal thickening. No mastoid effusion. SKELETON: No bony spinal canal stenosis. No lytic or blastic lesions. UPPER CHEST: Clear. OTHER: At the posterior midline neck, in line with the C5 spinous process, there is a round subcutaneous lesion measuring 21 x 19 mm. There is mild induration of the adjacent subcutaneous fat. The lesion extends to the skin. IMPRESSION: Rounded subcutaneous lesion of the posterior midline neck, most consistent with sebaceous/epidermal inclusion cyst, with mild associated inflammation. Electronically Signed   By: Deatra RobinsonKevin  Herman M.D.   On: 08/11/2018 14:32    Procedures Procedures (including critical care  time)  Medications Ordered in ED Medications - No data to display   Initial Impression / Assessment and Plan / ED Course  I have reviewed the triage vital signs and the nursing notes as well as past medical history.  Pertinent labs & imaging results that were available during my care of the patient were reviewed by me and considered in my medical decision making (see chart for details).  Presents for evaluation of neck cyst. Was previously seen on 08/11/18 for this issue and had CT showing 21x19 mm epidermoid cyst. Do not feel necessary to reimage at this time given normal neuro and normal musculoskeletal exam. Afebrile, non-toxic appearing.  Patient states that he did not know who to follow up with after discharge and he was worried that he didn't have insurance. Patient was unable to handle I&D at previous visit and was d/c with Doxycycline with surgery follow-up. No increase in size, induration or areas of fluctuance, fever or systemic symptoms. Shared desicion making with patient risk/benefits of repeat the I&D in the ED or follow-up with general surgery outpatient.He does not want drainage in the ED at the present.  At this time patient request to have evaluation of cyst outpatient for removal. He is having difficulty sleep secondary to pain at the site. Given short course of tylenol with codeine for sleep.  Discussed with return precautions. Voiced understanding. At d/c patient is on phone with [facility] scheduling an appointment for evaluation.     Final Clinical Impressions(s) / ED Diagnoses   Final diagnoses:  Epidermoid cyst of neck    ED Discharge Orders         Ordered    acetaminophen-codeine (TYLENOL #3) 300-30 MG tablet  Every 8 hours PRN     08/13/18 0940           Adger Cantera A, PA-C 08/13/18 1038    Vanetta MuldersZackowski, Scott, MD 08/18/18 [phone]

## 2018-08-13 NOTE — ED Notes (Signed)
ED Provider at bedside. 

## 2018-08-13 NOTE — ED Triage Notes (Signed)
Pt presents for evaluation of abscess to neck. Pt was here on 8/14 for same, did not have area drained at that time. States pain has worsened. Denies fever.

## 2018-08-13 NOTE — Discharge Instructions (Addendum)
You have an epidermoid cyst on the back of your neck. Since you were not able to tolerate the incision and drainage I would recommend following up with general surgery for removal. The number for the practice is listed on your discharge paperwork. Return for any new or concerning symptoms. Continue taking the doxycycline you were prescribed at the previous visit. You have been prescribed Tylenol with codeine for your pain until you can be seen by general surgery. Please take as prescribed and do not drive while talking this medication.  Contact a health care provider if: Your cyst has symptoms of infection. Your condition is not improving or is getting worse. You have a cyst that looks different from other cysts you have had. You have a fever.

## 2019-01-14 IMAGING — CT CT NECK W/ CM
4 of 6 series · 13 of 33 positions shown, 15 images · IV contrast (APPLIED)
Comparison: None.

CLINICAL DATA: Posterior painful neck mass.

EXAM:
CT NECK WITH CONTRAST
TECHNIQUE: Multidetector CT imaging of the neck was performed using the
standard protocol following the bolus administration of intravenous
contrast.
CONTRAST:  75mL OMNIPAQUE IOHEXOL 300 MG/ML  SOLN

[Series 3: axial neck · axial · 0.55mm/px · z∈[-218,-114]mm · 3 of 105 slices shown, 4 images]
[im 27/105  soft-tissue]
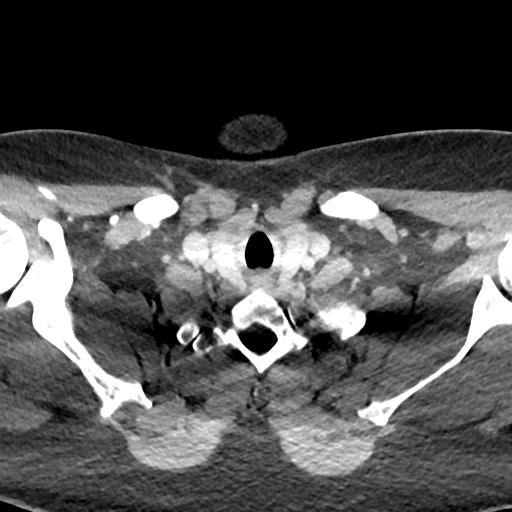
[im 27/105  bone]
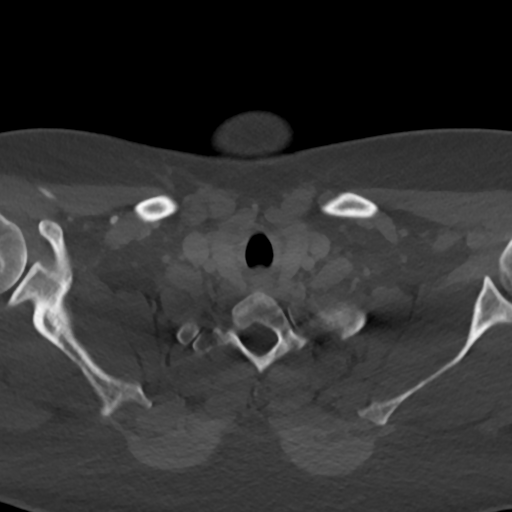
[im 53/105  bone]
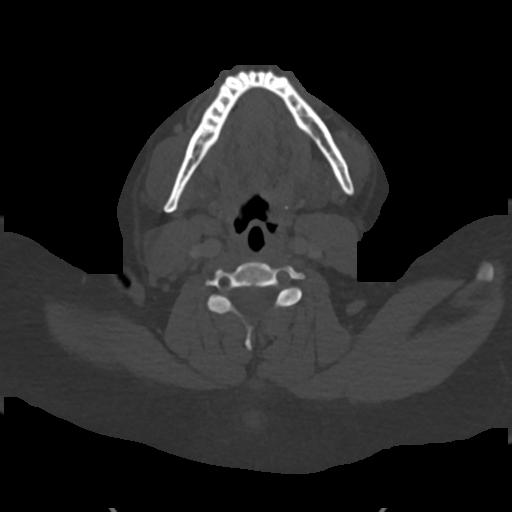
[im 79/105  bone]
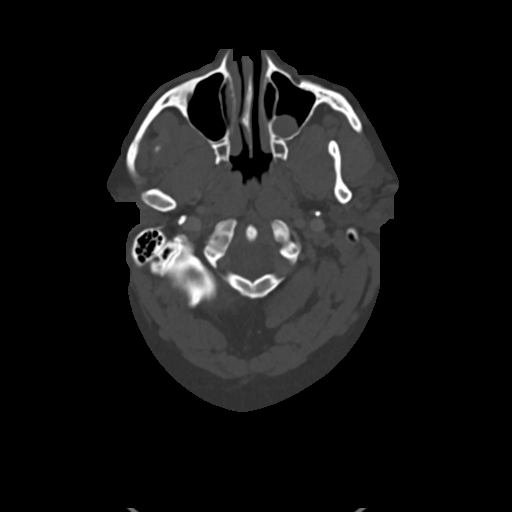

[Series 6: sag neck · sagittal · 0.41mm/px · 5 of 101 slices shown, 6 images]
[im 34/101  bone]
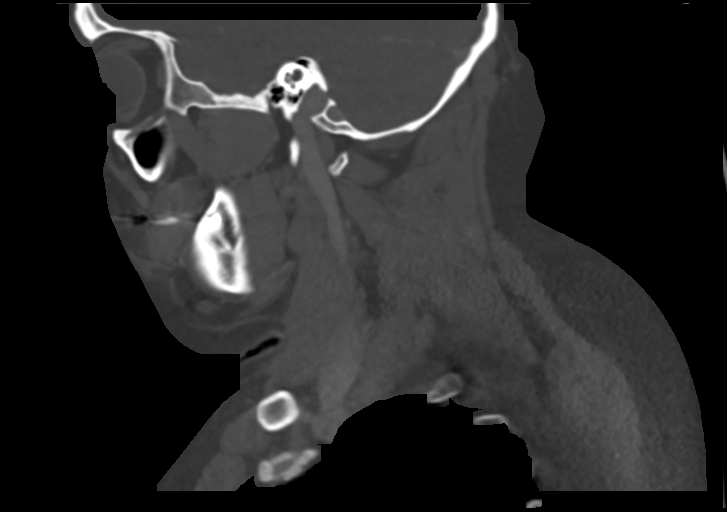
[im 42/101  bone]
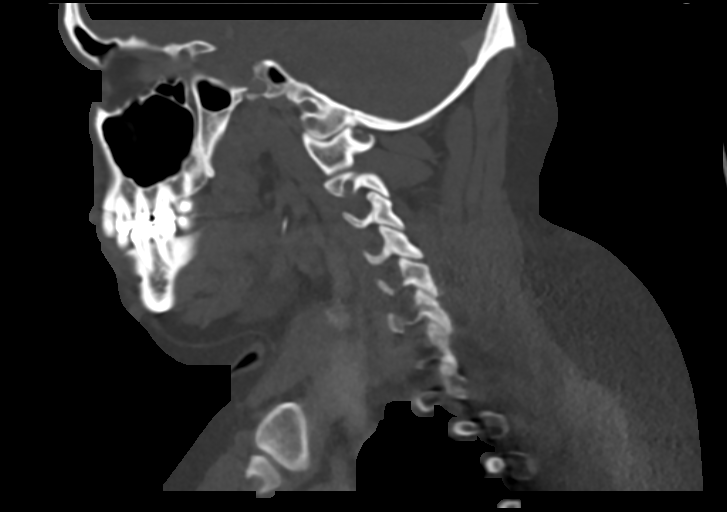
[im 51/101  soft-tissue]
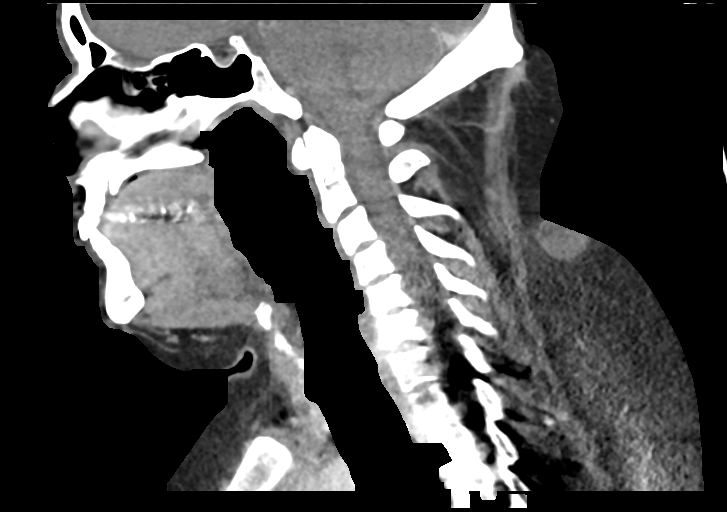
[im 51/101  bone]
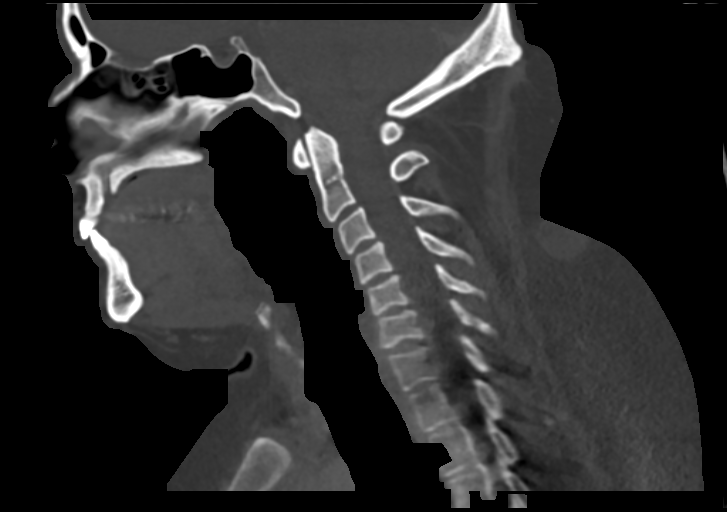
[im 59/101  bone]
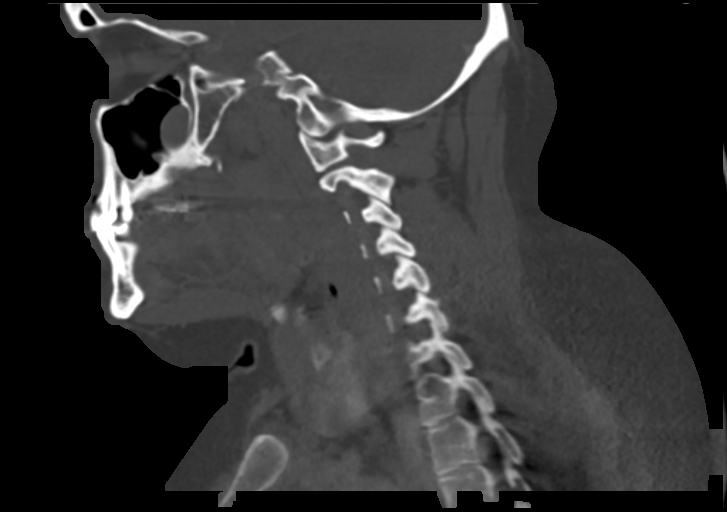
[im 67/101  bone]
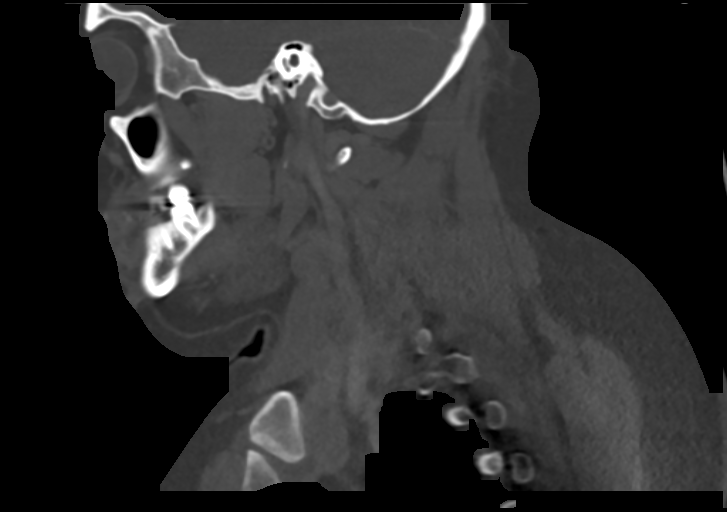

[Series 7: cor neck · coronal · 0.41mm/px · 3 of 141 slices shown]
[im 29/141  bone]
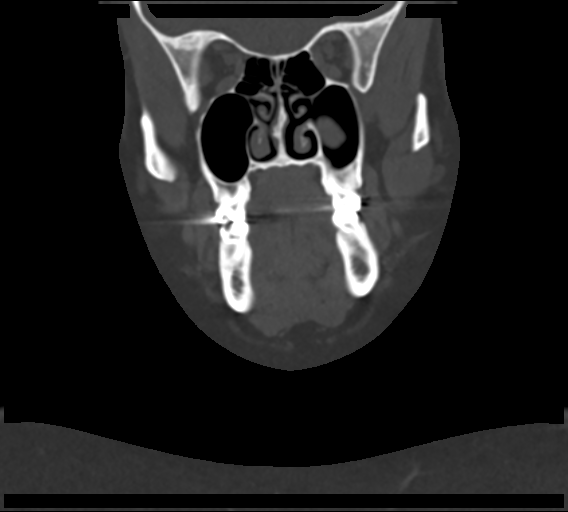
[im 57/141  bone]
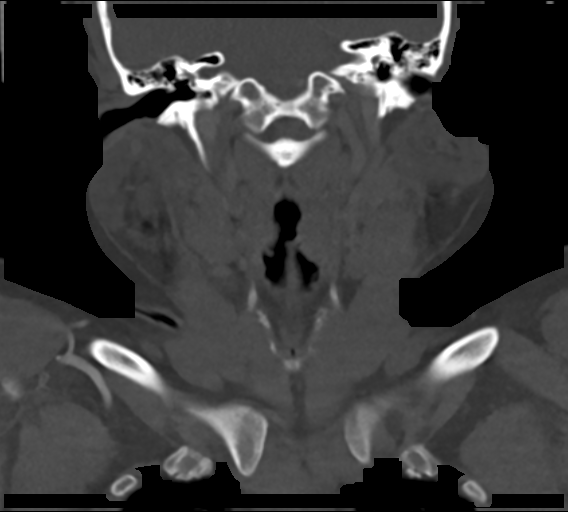
[im 85/141  bone]
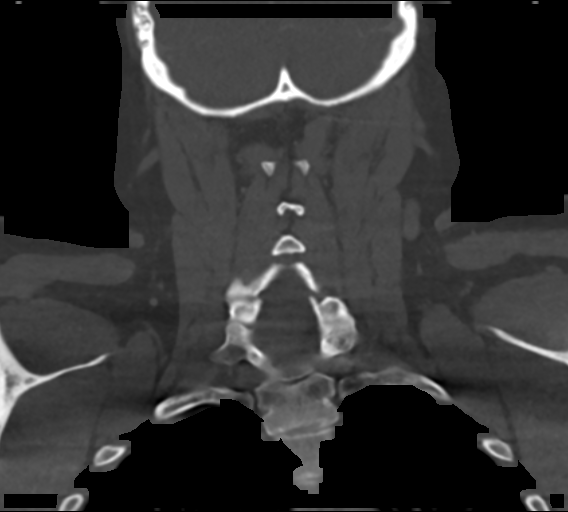

[Series 8: ax oropharynx · axial · 0.39mm/px · z∈[-214,-162]mm · 2 of 103 slices shown]
[im 26/103  bone]
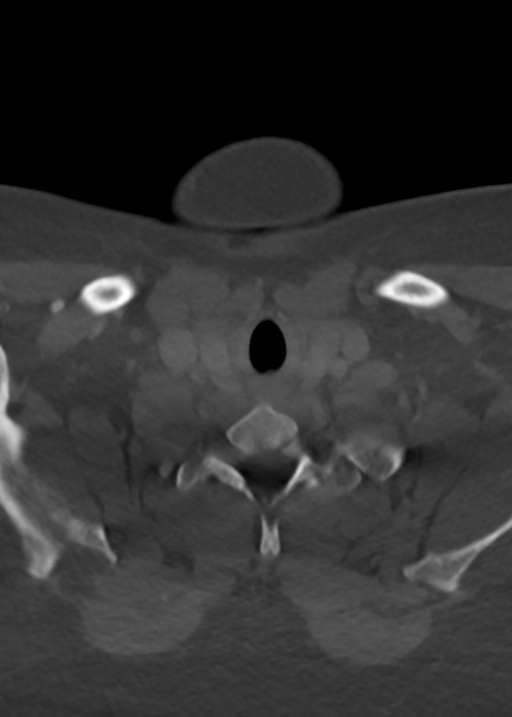
[im 52/103  bone]
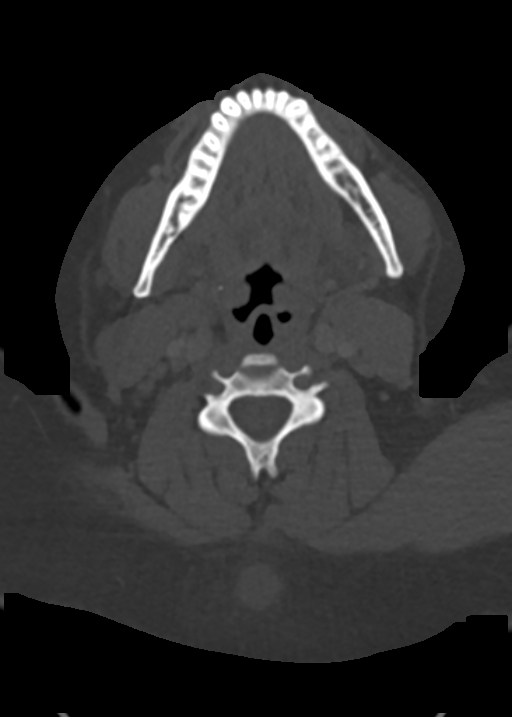

[13 of 33 positions shown; findings below may reference images not displayed]

FINDINGS: PHARYNX AND LARYNX:

--Nasopharynx: Fossae of [place] are clear. Normal adenoid
tonsils for age.

--Oral cavity and oropharynx: The palatine and lingual tonsils are
normal. The visible oral cavity and floor of mouth are normal.

--Hypopharynx: Normal vallecula and pyriform sinuses.

--Larynx: Normal epiglottis and pre-epiglottic space. Normal
aryepiglottic and vocal folds.

--Retropharyngeal space: No abscess, effusion or lymphadenopathy.

SALIVARY GLANDS:

--Parotid: No mass lesion or inflammation. No sialolithiasis or
ductal dilatation.

--Submandibular: Symmetric without inflammation. No sialolithiasis
or ductal dilatation.

--Sublingual: Normal. No ranula or other visible lesion of the base
of tongue and floor of mouth.

THYROID: Normal.

LYMPH NODES: No enlarged or abnormal density lymph nodes.

VASCULAR: Major cervical vessels are patent.

LIMITED INTRACRANIAL: Normal.

VISUALIZED ORBITS: Normal.

MASTOIDS AND VISUALIZED PARANASAL SINUSES: No fluid levels or
advanced mucosal thickening. No mastoid effusion.

SKELETON: No bony spinal canal stenosis. No lytic or blastic
lesions.

UPPER CHEST: Clear.

OTHER: At the posterior midline neck, in line with the C5 spinous
process, there is a round subcutaneous lesion measuring 21 x 19 mm.
There is mild induration of the adjacent subcutaneous fat. The
lesion extends to the skin.
IMPRESSION: Rounded subcutaneous lesion of the posterior midline neck, most
consistent with sebaceous/epidermal inclusion cyst, with mild
associated inflammation.
